# Patient Record
Sex: Male | Born: 1975 | Hispanic: Yes | Marital: Married
Health system: Southern US, Community
[De-identification: ages and names within clinical notes are randomized; demographics above are authoritative.]

---

## 2010-02-09 ENCOUNTER — Emergency Department: Payer: Self-pay | Admitting: Emergency Medicine

## 2011-06-01 IMAGING — CR DG RIBS 2V*L*
1 series · 2 of 2 positions shown · non-contrast
Comparison: none

REASON FOR EXAM: L flank pain
COMMENTS:   LMP: (Male)

PROCEDURE:     DXR - DXR RIBS LEFT UNILATERAL  - February 09, 2010 [DATE]
RESULT:     Two views of the left rib cage reveal the bones to be adequately
mineralized. I do not see evidence of an acute fracture. No pneumothorax or
pleural effusion is identified.

[Series 1: view not recorded · 0.17mm/px · 2 of 2 slices shown]
[im 1/2]
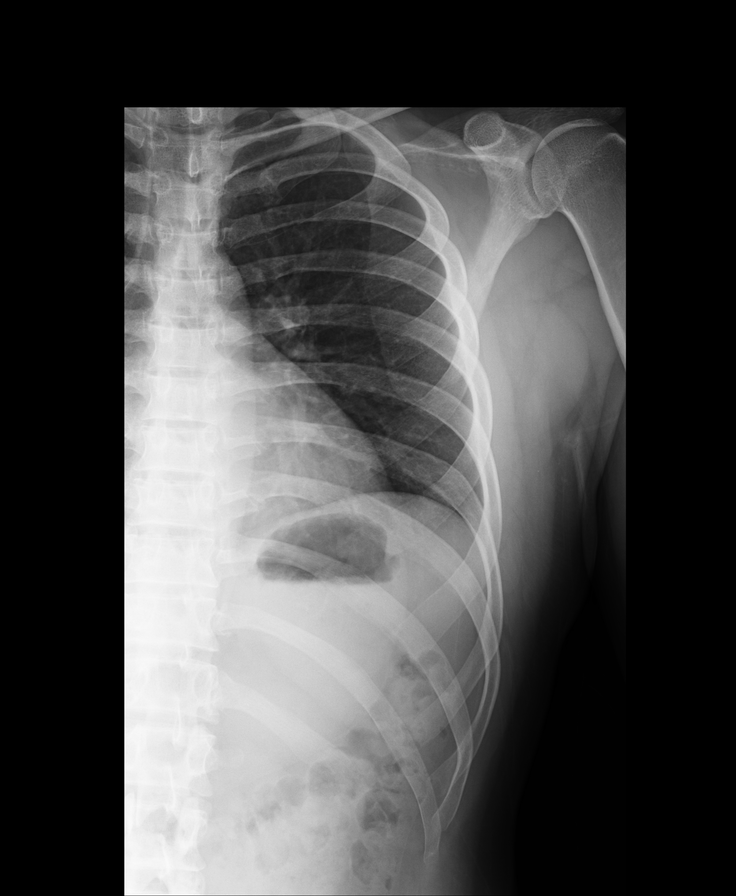
[im 2/2]
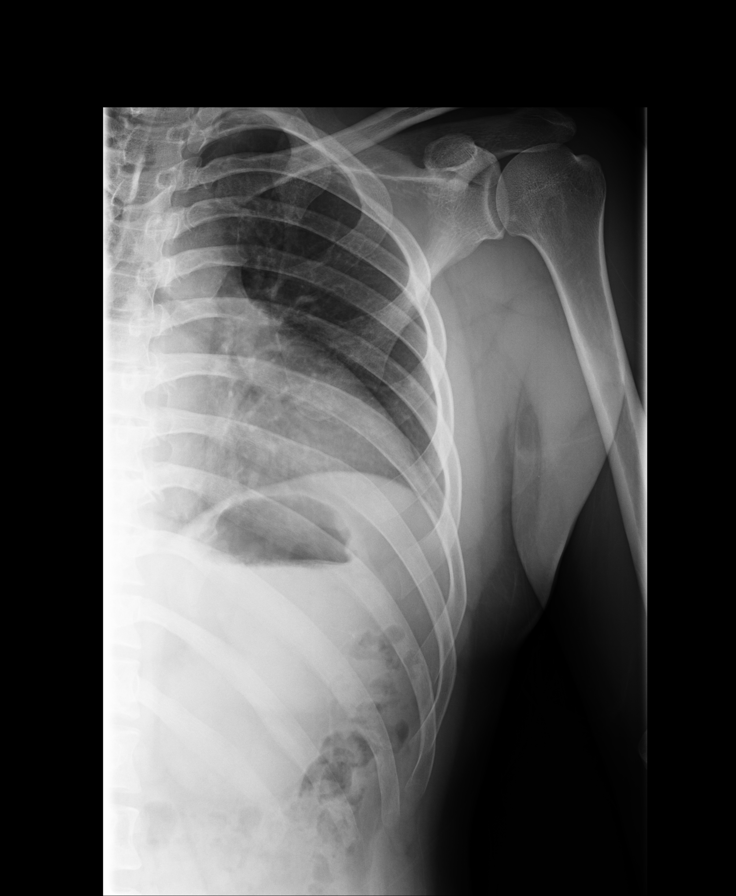

[2 of 2 positions shown; findings below may reference images not displayed]

IMPRESSION: I do not see evidence of an acute rib fracture on the left.

## 2019-01-17 ENCOUNTER — Emergency Department
Admission: EM | Admit: 2019-01-17 | Discharge: 2019-01-17 | Disposition: A | Discharge status: Other Acute Inpatient Hospital | Payer: Self-pay

## 2019-01-19 ENCOUNTER — Emergency Department
Admission: EM | Admit: 2019-01-19 | Discharge: 2019-01-19 | Disposition: A | Discharge status: Home / Self Care | Payer: Self-pay | Attending: Emergency Medicine | Admitting: Emergency Medicine

## 2019-01-19 ENCOUNTER — Encounter: Payer: Self-pay | Admitting: Emergency Medicine

## 2019-01-19 ENCOUNTER — Other Ambulatory Visit: Payer: Self-pay

## 2019-01-19 ENCOUNTER — Emergency Department: Payer: Self-pay

## 2019-01-19 DIAGNOSIS — U071 COVID-19: Secondary | ICD-10-CM | POA: Insufficient documentation

## 2019-01-19 MED ORDER — ZINC GLUCONATE 50 MG PO TABS: 50.0000 mg | ORAL_TABLET | Freq: Every day | ORAL | 1 refills | Status: AC

## 2019-01-19 NOTE — ED Provider Notes (Signed)
[facility] Emergency Department Provider Note       Time seen: ----------------------------------------- 1:40 PM on 01/19/2019 -----------------------------------------   I have reviewed the triage vital signs and the nursing notes.  HISTORY   Chief Complaint Shortness of Breath    HPI Andre Anderson is a 43 y.o. male with no past medical history who presents to the ED for increased shortness of breath.  Patient states although he has had some shortness of breath he feels like he is improving.  His brother was hospitalized for COVID-19.  He was positive when he was checked last Friday.  He denies fevers, chills or other complaints.  History reviewed. No pertinent past medical history.  There are no active problems to display for this patient.   History reviewed. No pertinent surgical history.  Allergies Patient has no allergy information on record.  Social History Social History   Tobacco Use  . Smoking status: Not on file  Substance Use Topics  . Alcohol use: Not on file  . Drug use: Not on file   Review of Systems Constitutional: Negative for fever. Cardiovascular: Negative for chest pain. Respiratory: Positive for shortness of breath Gastrointestinal: Negative for abdominal pain, vomiting and diarrhea. Musculoskeletal: Negative for back pain. Skin: Negative for rash. Neurological: Negative for headaches, focal weakness or numbness.  All systems negative/normal/unremarkable except as stated in the HPI  ____________________________________________   PHYSICAL EXAM:  VITAL SIGNS: ED Triage Vitals [01/19/19 1331]  Enc Vitals Group     BP 120/84     Pulse Rate 80     Resp 16     Temp 98.8 F (37.1 C)     Temp Source Oral     SpO2 95 %     Weight 170 lb (77.1 kg)     Height 5\' 9"  (1.753 m)     Head Circumference      Peak Flow      Pain Score 0     Pain Loc      Pain Edu?      Excl. in GC?    Constitutional: Alert  and oriented. Well appearing and in no distress. Eyes: Conjunctivae are normal. Normal extraocular movements. Cardiovascular: Normal rate, regular rhythm. No murmurs, rubs, or gallops. Respiratory: Normal respiratory effort without tachypnea nor retractions. Breath sounds are clear and equal bilaterally. No wheezes/rales/rhonchi. Gastrointestinal: Soft and nontender. Normal bowel sounds Musculoskeletal: Nontender with normal range of motion in extremities. No lower extremity tenderness nor edema. Neurologic:  Normal speech and language. No gross focal neurologic deficits are appreciated.  Skin:  Skin is warm, dry and intact. No rash noted. Psychiatric: Mood and affect are normal. Speech and behavior are normal.  ____________________________________________  ED COURSE:  As part of my medical decision making, I reviewed the following data within the electronic MEDICAL RECORD NUMBER History obtained from family if available, nursing notes, old chart and ekg, as well as notes from prior ED visits. Patient presented for dyspnea, we will assess with labs and imaging as indicated at this time.   Procedures  Andre Anderson was evaluated in Emergency Department on 01/19/2019 for the symptoms described in the history of present illness. He was evaluated in the context of the global COVID-19 pandemic, which necessitated consideration that the patient might be at risk for infection with the SARS-CoV-2 virus that causes COVID-19. Institutional protocols and algorithms that pertain to the evaluation of patients at risk for COVID-19 are in a state of rapid change  based on information released by regulatory bodies including the CDC and federal and state organizations. These policies and algorithms were followed during the patient's care in the ED.   RADIOLOGY Images were viewed by me  Chest x-ray IMPRESSION: Hypoinflation of the lungs. Bibasilar lung opacities are noted, right greater than left,  concerning for multifocal pneumonia or possibly atelectasis.  ____________________________________________   DIFFERENTIAL DIAGNOSIS   COVID-19, pneumonia  FINAL ASSESSMENT AND PLAN  COVID-19   Plan: The patient had presented for shortness of breath.  Patient recently tested positive as an outpatient for COVID-19.  Patient's imaging did reveal bibasilar lung opacities that are consistent with COVID-19.  Overall his vitals and his exam are normal.  He is cleared for outpatient follow-up.   Laurence Aly, MD    Note: This note was generated in part or whole with voice recognition software. Voice recognition is usually quite accurate but there are transcription errors that can and very often do occur. I apologize for any typographical errors that were not detected and corrected.     Earleen Newport, MD 01/19/19 [phone]

## 2019-01-19 NOTE — ED Triage Notes (Signed)
PT states he was diagnosed with COVID 19 last Friday and reports increased shortness of breath since.

## 2019-11-10 ENCOUNTER — Ambulatory Visit: Payer: Self-pay | Attending: Internal Medicine

## 2019-11-10 DIAGNOSIS — Z23 Encounter for immunization: Secondary | ICD-10-CM

## 2019-11-10 NOTE — Progress Notes (Signed)
   Covid-19 Vaccination Clinic  Name:  Deniro Laymon    MRN: [id number] DOB: 01-25-1976  11/10/2019  Mr. Dearius Hoffmann was observed post Covid-19 immunization for 15 minutes without incident. He was provided with Vaccine Information Sheet and instruction to access the V-Safe system.   Mr. Marquay Kruse was instructed to call 911 with any severe reactions post vaccine: Marland Kitchen Difficulty breathing  . Swelling of face and throat  . A fast heartbeat  . A bad rash all over body  . Dizziness and weakness   Immunizations Administered    Name Date Dose VIS Date Route   Pfizer COVID-19 Vaccine 11/10/2019  9:41 AM 0.3 mL 07/23/2019 Intramuscular   Manufacturer: [employer], [employer]   Lot: [id number]   NDC: [address]-2

## 2019-11-10 NOTE — Progress Notes (Signed)
   Covid-19 Vaccination Clinic  Name:  Andre Anderson    MRN: [id number] DOB: 08/17/1975  11/10/2019  Andre Anderson was observed post Covid-19 immunization for 15 minutes without incident. He was provided with Vaccine Information Sheet and instruction to access the V-Safe system.   Andre Anderson was instructed to call 911 with any severe reactions post vaccine: . Difficulty breathing  . Swelling of face and throat  . A fast heartbeat  . A bad rash all over body  . Dizziness and weakness   Immunizations Administered    Name Date Dose VIS Date Route   Pfizer COVID-19 Vaccine 11/10/2019  9:41 AM 0.3 mL 07/23/2019 Intramuscular   Manufacturer: Pfizer, Inc   Lot: [id number]   NDC: [address]-1     

## 2019-12-05 ENCOUNTER — Ambulatory Visit: Payer: Self-pay | Attending: Internal Medicine

## 2019-12-05 DIAGNOSIS — Z23 Encounter for immunization: Secondary | ICD-10-CM

## 2019-12-05 NOTE — Progress Notes (Signed)
   Covid-19 Vaccination Clinic  Name:  Shafer Swamy    MRN: [id number] DOB: Feb 22, 1976  12/05/2019  Mr. Brennin Durfee was observed post Covid-19 immunization for 15 minutes without incident. He was provided with Vaccine Information Sheet and instruction to access the V-Safe system.   Mr. Keondrick Dilks was instructed to call 911 with any severe reactions post vaccine: Marland Kitchen Difficulty breathing  . Swelling of face and throat  . A fast heartbeat  . A bad rash all over body  . Dizziness and weakness   Immunizations Administered    Name Date Dose VIS Date Route   Pfizer COVID-19 Vaccine 12/05/2019  9:21 AM 0.3 mL 10/06/2018 Intramuscular   Manufacturer: [employer], [employer]   Lot: [id number]   NDC: [address]-8

## 2020-05-10 IMAGING — DX CHEST  1 VIEW
1 series · 1 of 1 positions shown · non-contrast
Comparison: None.

CLINICAL DATA: Dyspnea.

EXAM:
CHEST  1 VIEW

[chest ap]
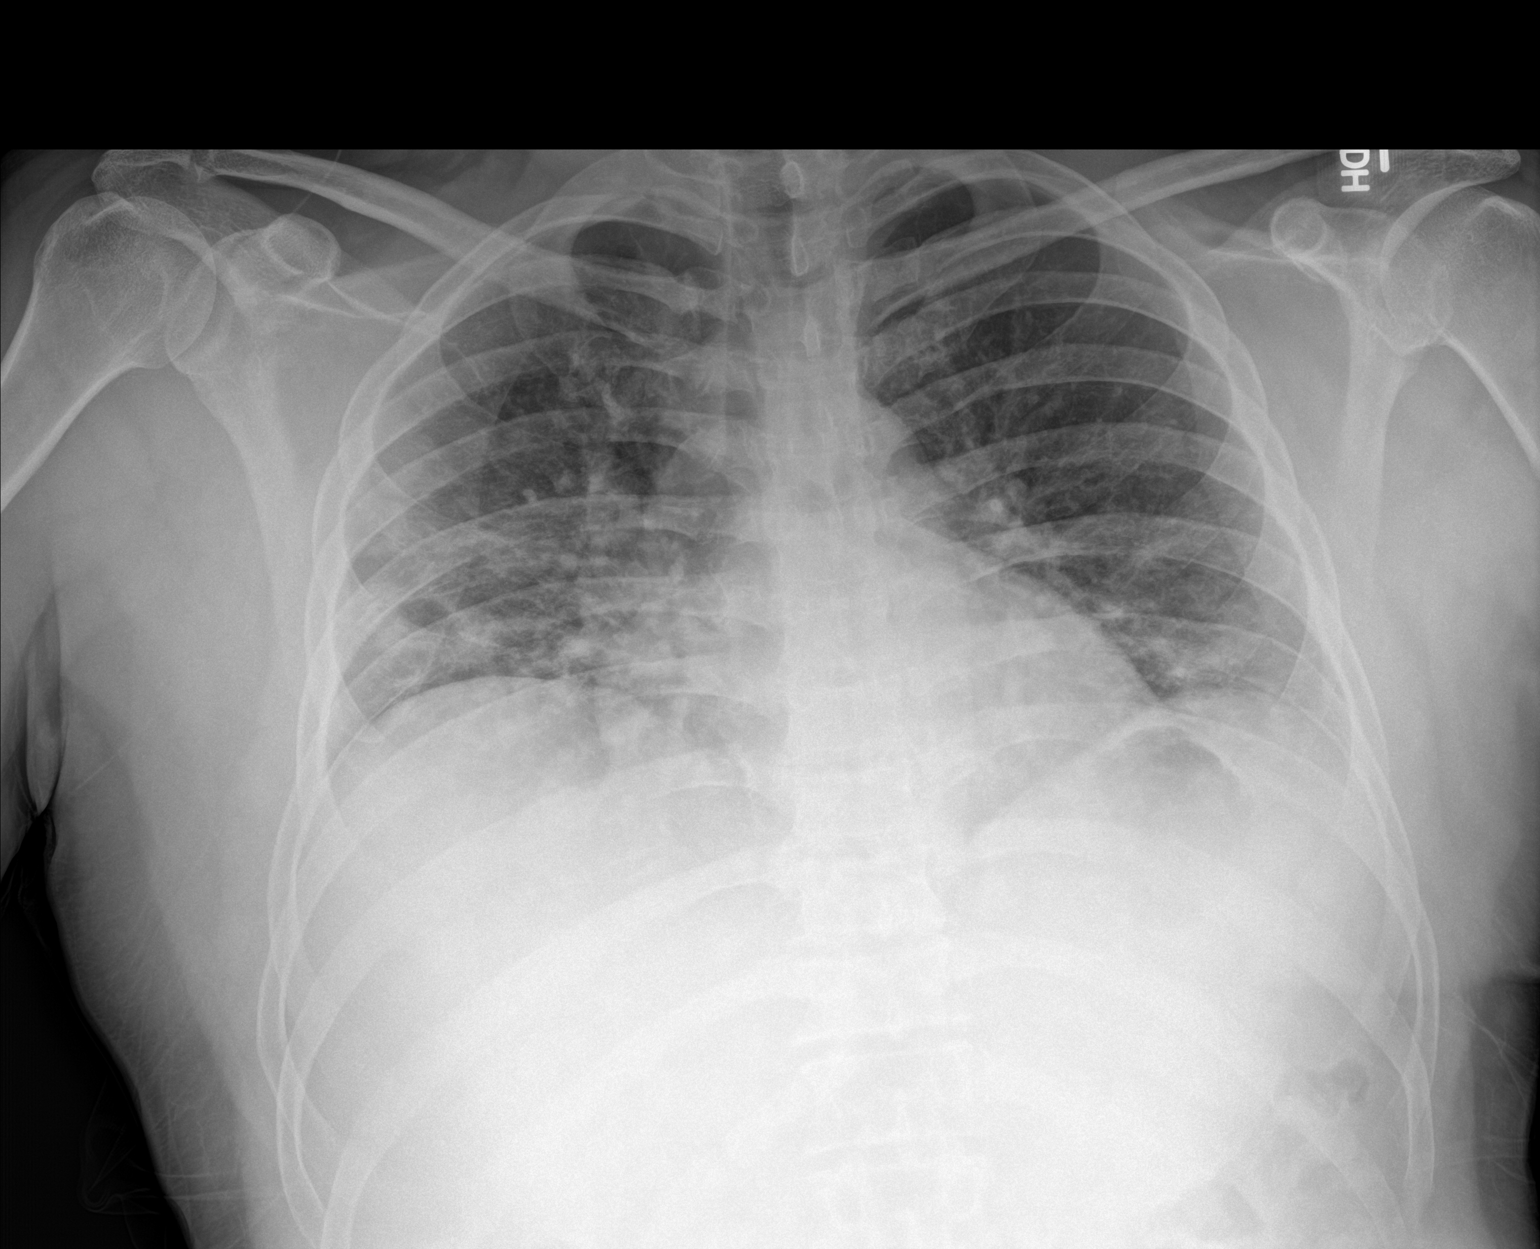

[1 of 1 positions shown; findings below may reference images not displayed]

FINDINGS: The heart size and mediastinal contours are within normal limits. No
pneumothorax or pleural effusion is noted. Hypoinflation of the
lungs is noted. Bibasilar lung opacities are noted, right greater
than left, concerning for pneumonia or possibly atelectasis. The
visualized skeletal structures are unremarkable.
IMPRESSION: Hypoinflation of the lungs. Bibasilar lung opacities are noted,
right greater than left, concerning for multifocal pneumonia or
possibly atelectasis.
